# Patient Record
Sex: Male | Born: 1990 | Race: White | Hispanic: No | Marital: Single | Smoking: Never smoker
Health system: Southern US, Community
[De-identification: ages and names within clinical notes are randomized; demographics above are authoritative.]

## PROBLEM LIST (undated history)

## (undated) DIAGNOSIS — J45909 Unspecified asthma, uncomplicated: Secondary | ICD-10-CM

## (undated) DIAGNOSIS — I10 Essential (primary) hypertension: Secondary | ICD-10-CM

## (undated) HISTORY — DX: Essential (primary) hypertension: I10

---
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
## 2014-07-25 ENCOUNTER — Emergency Department (HOSPITAL_COMMUNITY)
Admission: EM | Admit: 2014-07-25 | Discharge: 2014-07-25 | Disposition: A | Discharge status: Home / Self Care | Payer: Self-pay | Attending: Emergency Medicine | Admitting: Emergency Medicine

## 2014-07-25 ENCOUNTER — Encounter (HOSPITAL_COMMUNITY): Payer: Self-pay | Admitting: *Deleted

## 2014-07-25 DIAGNOSIS — R739 Hyperglycemia, unspecified: Secondary | ICD-10-CM | POA: Insufficient documentation

## 2014-07-25 DIAGNOSIS — J45909 Unspecified asthma, uncomplicated: Secondary | ICD-10-CM | POA: Insufficient documentation

## 2014-07-25 DIAGNOSIS — R55 Syncope and collapse: Secondary | ICD-10-CM | POA: Insufficient documentation

## 2014-07-25 DIAGNOSIS — R42 Dizziness and giddiness: Secondary | ICD-10-CM | POA: Insufficient documentation

## 2014-07-25 HISTORY — DX: Unspecified asthma, uncomplicated: J45.909

## 2014-07-25 LAB — COMPREHENSIVE METABOLIC PANEL
ALK PHOS: 65 U/L (ref 39–117)
ALT: 27 U/L (ref 0–53)
AST: 22 U/L (ref 0–37)
Albumin: 3.9 g/dL (ref 3.5–5.2)
Anion gap: 13 (ref 5–15)
BUN: 11 mg/dL (ref 6–23)
CALCIUM: 9.6 mg/dL (ref 8.4–10.5)
CO2: 27 meq/L (ref 19–32)
Chloride: 101 mEq/L (ref 96–112)
Creatinine, Ser: 0.8 mg/dL (ref 0.50–1.35)
GFR calc Af Amer: >90 mL/min (ref >90)
GFR calc non Af Amer: >90 mL/min (ref >90)
Glucose, Bld: 94 mg/dL (ref 70–99)
POTASSIUM: 4.4 meq/L (ref 3.7–5.3)
SODIUM: 141 meq/L (ref 137–147)
TOTAL PROTEIN: 7.3 g/dL (ref 6.0–8.3)
Total Bilirubin: 0.4 mg/dL (ref 0.3–1.2)

## 2014-07-25 LAB — CBC WITH DIFFERENTIAL/PLATELET
BASOS ABS: 0 10*3/uL (ref 0.0–0.1)
Basophils Relative: 0 % (ref 0–1)
EOS ABS: 0 10*3/uL (ref 0.0–0.7)
EOS PCT: 0 % (ref 0–5)
HCT: 43.1 % (ref 39.0–52.0)
Hemoglobin: 14.3 g/dL (ref 13.0–17.0)
LYMPHS ABS: 2.1 10*3/uL (ref 0.7–4.0)
LYMPHS PCT: 20 % (ref 12–46)
MCH: 31 pg (ref 26.0–34.0)
MCHC: 33.2 g/dL (ref 30.0–36.0)
MCV: 93.5 fL (ref 78.0–100.0)
Monocytes Absolute: 1.1 10*3/uL — ABNORMAL HIGH (ref 0.1–1.0)
Monocytes Relative: 11 % (ref 3–12)
NEUTROS PCT: 69 % (ref 43–77)
Neutro Abs: 7.1 10*3/uL (ref 1.7–7.7)
PLATELETS: 254 10*3/uL (ref 150–400)
RBC: 4.61 MIL/uL (ref 4.22–5.81)
RDW: 11.9 % (ref 11.5–15.5)
WBC: 10.3 10*3/uL (ref 4.0–10.5)

## 2014-07-25 LAB — CBG MONITORING, ED
GLUCOSE-CAPILLARY: 97 mg/dL (ref 70–99)
Glucose-Capillary: 121 mg/dL — ABNORMAL HIGH (ref 70–99)

## 2014-07-25 MED ORDER — SODIUM CHLORIDE 0.9 % IV SOLN
1000.0000 mL | Freq: Once | INTRAVENOUS | Status: AC
  Administered 2014-07-25: 1000 mL via INTRAVENOUS

## 2014-07-25 NOTE — ED Notes (Signed)
Pt states he was at work and stood up felt dizzy, so he ate something then he  Passed out. States this has happened before. Upon assessing orthostatics pt reported dizziness upon sitting up and standing with a head rush.

## 2014-07-25 NOTE — Discharge Instructions (Signed)
Syncope Syncope is a medical term for fainting or passing out. This means you lose consciousness and drop to the ground. People are generally unconscious for less than 5 minutes. You may have some muscle twitches for up to 15 seconds before waking up and returning to normal. Syncope occurs more often in older adults, but it can happen to anyone. While most causes of syncope are not dangerous, syncope can be a sign of a serious medical problem. It is important to seek medical care.  CAUSES  Syncope is caused by a sudden drop in blood flow to the brain. The specific cause is often not determined. Factors that can bring on syncope include:  Taking medicines that lower blood pressure.  Sudden changes in posture, such as standing up quickly.  Taking more medicine than prescribed.  Standing in one place for too long.  Seizure disorders.  Dehydration and excessive exposure to heat.  Low blood sugar (hypoglycemia).  Straining to have a bowel movement.  Heart disease, irregular heartbeat, or other circulatory problems.  Fear, emotional distress, seeing blood, or severe pain. SYMPTOMS  Right before fainting, you may:  Feel dizzy or light-headed.  Feel nauseous.  See all white or all black in your field of vision.  Have cold, clammy skin. DIAGNOSIS  Your health care provider will ask about your symptoms, perform a physical exam, and perform an electrocardiogram (ECG) to record the electrical activity of your heart. Your health care provider may also perform other heart or blood tests to determine the cause of your syncope which may include:  Transthoracic echocardiogram (TTE). During echocardiography, sound waves are used to evaluate how blood flows through your heart.  Transesophageal echocardiogram (TEE).  Cardiac monitoring. This allows your health care provider to monitor your heart rate and rhythm in real time.  Holter monitor. This is a portable device that records your  heartbeat and can help diagnose heart arrhythmias. It allows your health care provider to track your heart activity for several days, if needed.  Stress tests by exercise or by giving medicine that makes the heart beat faster. TREATMENT  In most cases, no treatment is needed. Depending on the cause of your syncope, your health care provider may recommend changing or stopping some of your medicines. HOME CARE INSTRUCTIONS  Have someone stay with you until you feel stable.  Do not drive, use machinery, or play sports until your health care provider says it is okay.  Keep all follow-up appointments as directed by your health care provider.  Lie down right away if you start feeling like you might faint. Breathe deeply and steadily. Wait until all the symptoms have passed.  Drink enough fluids to keep your urine clear or pale yellow.  If you are taking blood pressure or heart medicine, get up slowly and take several minutes to sit and then stand. This can reduce dizziness. SEEK IMMEDIATE MEDICAL CARE IF:   You have a severe headache.  You have unusual pain in the chest, abdomen, or back.  You are bleeding from your mouth or rectum, or you have black or tarry stool.  You have an irregular or very fast heartbeat.  You have pain with breathing.  You have repeated fainting or seizure-like jerking during an episode.  You faint when sitting or lying down.  You have confusion.  You have trouble walking.  You have severe weakness.  You have vision problems. If you fainted, call your local emergency services (911 in [place] Do  not drive yourself to the hospital.  MAKE SURE YOU:  Understand these instructions.  Will watch your condition.  Will get help right away if you are not doing well or get worse. Document Released: 08/11/2005 Document Revised: 08/16/2013 Document Reviewed: 10/10/2011 [facility] Patient Information 2015 [place], [place]. This information is not intended to replace  advice given to you by your health care provider. Make sure you discuss any questions you have with your health care provider. Cardiac Event Monitoring A cardiac event monitor is a small recording device used to help detect abnormal heart rhythms (arrhythmias). The monitor is used to record heart rhythm when noticeable symptoms such as the following occur:  Fast heartbeats (palpitations), such as heart racing or fluttering.  Dizziness.  Fainting or light-headedness.  Unexplained weakness. The monitor is wired to two electrodes placed on your chest. Electrodes are flat, sticky disks that attach to your skin. The monitor can be worn for up to 30 days. You will wear the monitor at all times, except when bathing.  HOW TO USE YOUR CARDIAC EVENT MONITOR A technician will prepare your chest for the electrode placement. The technician will show you how to place the electrodes, how to work the monitor, and how to replace the batteries. Take time to practice using the monitor before you leave the office. Make sure you understand how to send the information from the monitor to your health care provider. This requires a telephone with a landline, not a cell phone. You need to:  Wear your monitor at all times, except when you are in water:  Do not get the monitor wet.  Take the monitor off when bathing. Do not swim or use a hot tub with it on.  Keep your skin clean. Do not put body lotion or moisturizer on your chest.  Change the electrodes daily or any time they stop sticking to your skin. You might need to use tape to keep them on.  It is possible that your skin under the electrodes could become irritated. To keep this from happening, try to put the electrodes in slightly different places on your chest. However, they must remain in the area under your left breast and in the upper right section of your chest.  Make sure the monitor is safely clipped to your clothing or in a location close to your body  that your health care provider recommends.  Press the button to record when you feel symptoms of heart trouble, such as dizziness, weakness, light-headedness, palpitations, thumping, shortness of breath, unexplained weakness, or a fluttering or racing heart. The monitor is always on and records what happened slightly before you pressed the button, so do not worry about being too late to get good information.  Keep a diary of your activities, such as walking, doing chores, and taking medicine. It is especially important to note what you were doing when you pushed the button to record your symptoms. This will help your health care provider determine what might be contributing to your symptoms. The information stored in your monitor will be reviewed by your health care provider alongside your diary entries.  Send the recorded information as recommended by your health care provider. It is important to understand that it will take some time for your health care provider to process the results.  Change the batteries as recommended by your health care provider. SEEK IMMEDIATE MEDICAL CARE IF:   You have chest pain.  You have extreme difficulty breathing or shortness of breath.  You develop a very fast heartbeat that persists.  You develop dizziness that does not go away.  You faint or constantly feel you are about to faint. Document Released: 05/20/2008 Document Revised: 12/26/2013 Document Reviewed: 02/07/2013 [facility] Patient Information 2015 [place], [place]. This information is not intended to replace advice given to you by your health care provider. Make sure you discuss any questions you have with your health care provider.  Holter Monitoring A Holter monitor is a small device with electrodes (small sticky patches) that attach to your chest. It records the electrical activity of your heart and is worn continuously for 24-48 hours.  A HOLTER MONITOR IS USED TO  Detect heart problems such  as:  Heart arrhythmia. Is an abnormal or irregular heartbeat. With some heart arrhythmias, you may not feel or know that you have an irregular heart rhythm.  Palpitations, such as feeling your heart racing or fluttering. It is possible to have heart palpitations and not have a heart arrhythmia.  A heart rhythm that is too slow or too fast.  If you have problems fainting, near fainting or feeling light-headed, a Holter monitor may be worn to see if your heart is the cause. HOLTER MONITOR PREPARATION   Electrodes will be attached to the skin on your chest.  If you have hair on your chest, small areas may have to be shaved. This is done to help the patches stick better and make the recording more accurate.  The electrodes are attached by wires to the Holter monitor. The Holter monitor clips to your clothing. You will wear the monitor at all times, even while exercising and sleeping. HOME CARE INSTRUCTIONS   Wear your monitor at all times.  The wires and the monitor must stay dry. Do not get the monitor wet.  Do not bathe, swim or use a hot tub with it on.  You may do a "sponge" bath while you have the monitor on.  Keep your skin clean, do not put body lotion or moisturizer on your chest.  It's possible that your skin under the electrodes could become irritated. To keep this from happening, you may put the electrodes in slightly different places on your chest.  Your caregiver will also ask you to keep a diary of your activities, such as walking or doing chores. Be sure to note what you are doing if you experience heart symptoms such as palpitations. This will help your caregiver determine what might be contributing to your symptoms. The information stored in your monitor will be reviewed by your caregiver alongside your diary entries.  Make sure the monitor is safely clipped to your clothing or in a location close to your body that your caregiver recommends.  The monitor and electrodes  are removed when the test is over. Return the monitor as directed.  Be sure to follow up with your caregiver and discuss your Holter monitor results. SEEK IMMEDIATE MEDICAL CARE IF:  You faint or feel lightheaded.  You have trouble breathing.  You get pain in your chest, upper arm or jaw.  You feel sick to your stomach and your skin is pale, cool, or damp.  You think something is wrong with the way your heart is beating. MAKE SURE YOU:   Understand these instructions.  Will watch your condition.  Will get help right away if you are not doing well or get worse. Document Released: 05/09/2004 Document Revised: 11/03/2011 Document Reviewed: 09/21/2008 [facility]ExitCare Patient Information 2015 [place]ExitCare, [place]. This information is  not intended to replace advice given to you by your health care provider. Make sure you discuss any questions you have with your health care provider.  Hyperglycemia Hyperglycemia occurs when the glucose (sugar) in your blood is too high. Hyperglycemia can happen for many reasons, but it most often happens to people who do not know they have diabetes or are not managing their diabetes properly.  CAUSES  Whether you have diabetes or not, there are other causes of hyperglycemia. Hyperglycemia can occur when you have diabetes, but it can also occur in other situations that you might not be as aware of, such as: Diabetes  If you have diabetes and are having problems controlling your blood glucose, hyperglycemia could occur because of some of the following reasons:  Not following your meal plan.  Not taking your diabetes medications or not taking it properly.  Exercising less or doing less activity than you normally do.  Being sick. Pre-diabetes  This cannot be ignored. Before people develop Type 2 diabetes, they almost always have "pre-diabetes." This is when your blood glucose levels are higher than normal, but not yet high enough to be diagnosed as diabetes.  Research has shown that some long-term damage to the body, especially the heart and circulatory system, may already be occurring during pre-diabetes. If you take action to manage your blood glucose when you have pre-diabetes, you may delay or prevent Type 2 diabetes from developing. Stress  If you have diabetes, you may be "diet" controlled or on oral medications or insulin to control your diabetes. However, you may find that your blood glucose is higher than usual in the hospital whether you have diabetes or not. This is often referred to as "stress hyperglycemia." Stress can elevate your blood glucose. This happens because of hormones put out by the body during times of stress. If stress has been the cause of your high blood glucose, it can be followed regularly by your caregiver. That way he/she can make sure your hyperglycemia does not continue to get worse or progress to diabetes. Steroids  Steroids are medications that act on the infection fighting system (immune system) to block inflammation or infection. One side effect can be a rise in blood glucose. Most people can produce enough extra insulin to allow for this rise, but for those who cannot, steroids make blood glucose levels go even higher. It is not unusual for steroid treatments to "uncover" diabetes that is developing. It is not always possible to determine if the hyperglycemia will go away after the steroids are stopped. A special blood test called an A1c is sometimes done to determine if your blood glucose was elevated before the steroids were started. SYMPTOMS  Thirsty.  Frequent urination.  Dry mouth.  Blurred vision.  Tired or fatigue.  Weakness.  Sleepy.  Tingling in feet or leg. DIAGNOSIS  Diagnosis is made by monitoring blood glucose in one or all of the following ways:  A1c test. This is a chemical found in your blood.  Fingerstick blood glucose monitoring.  Laboratory results. TREATMENT  First, knowing the  cause of the hyperglycemia is important before the hyperglycemia can be treated. Treatment may include, but is not be limited to:  Education.  Change or adjustment in medications.  Change or adjustment in meal plan.  Treatment for an illness, infection, etc.  More frequent blood glucose monitoring.  Change in exercise plan.  Decreasing or stopping steroids.  Lifestyle changes. HOME CARE INSTRUCTIONS   Test your blood glucose  as directed.  Exercise regularly. Your caregiver will give you instructions about exercise. Pre-diabetes or diabetes which comes on with stress is helped by exercising.  Eat wholesome, balanced meals. Eat often and at regular, fixed times. Your caregiver or nutritionist will give you a meal plan to guide your sugar intake.  Being at an ideal weight is important. If needed, losing as little as 10 to 15 pounds may help improve blood glucose levels. SEEK MEDICAL CARE IF:   You have questions about medicine, activity, or diet.  You continue to have symptoms (problems such as increased thirst, urination, or weight gain). SEEK IMMEDIATE MEDICAL CARE IF:   You are vomiting or have diarrhea.  Your breath smells fruity.  You are breathing faster or slower.  You are very sleepy or incoherent.  You have numbness, tingling, or pain in your feet or hands.  You have chest pain.  Your symptoms get worse even though you have been following your caregiver's orders.  If you have any other questions or concerns. Document Released: 02/04/2001 Document Revised: 11/03/2011 Document Reviewed: 12/08/2011 [facility] Patient Information 2015 [place], [place]. This information is not intended to replace advice given to you by your health care provider. Make sure you discuss any questions you have with your health care provider.

## 2014-07-25 NOTE — ED Provider Notes (Signed)
CSN: [id number]     Arrival date & time 07/25/14  1154 History   First MD Initiated Contact with Patient 07/25/14 1516     Chief Complaint  Patient presents with  . Loss of Consciousness     (Consider location/radiation/quality/duration/timing/severity/associated sxs/prior Treatment) HPI  This is a 23 year old male who presents emergency Department with chief complaint of syncopal episode. Patient states that he was at work today when he started feeling presyncopal prodrome. He states that he felt dizzy, lightheaded and had a feeling of emptiness in the abdomen. He denies any racing or skipping in the heart, he recently volume loss, melena, hematochezia, polyuria or polydipsia. Patient states that he needed to the bathroom and syncopized in the restroom. He denies hitting his head. He states he landed on top of his arm. He states that he awoke and knew immediately that he had passed out. He denies history of seizures. Patient denies any history of sudden cardiac death in his family. Patient works in a [employer] and sweats a lot at work. He states he drinks about 10-12 ounces of water daily. Patient has had a cyst previous episode of presyncope which occurred about 2 weeks ago. He states he was lying in bed when he suddenly felt dizzy and felt as if he were going to pass out. However, he didn't. He denies racing or skipping in his heart. At that time Past Medical History  Diagnosis Date  . Asthma    History reviewed. No pertinent past surgical history. No family history on file. History  Substance Use Topics  . Smoking status: Never Smoker   . Smokeless tobacco: Not on file  . Alcohol Use: Not on file    Review of Systems  Ten systems reviewed and are negative for acute change, except as noted in the HPI.    Allergies  Review of patient's allergies indicates no known allergies.  Home Medications   Prior to Admission medications   Not on File   BP 139/77 mmHg  Pulse 76   Temp(Src) 98.1 F (36.7 C) (Oral)  Ht 5\' 8"  (1.727 m)  Wt 153 lb (69.4 kg)  BMI 23.27 kg/m2  SpO2 100% Physical Exam  Constitutional: He is oriented to person, place, and time. He appears well-developed and well-nourished. No distress.  HENT:  Head: Normocephalic and atraumatic.  Eyes: Conjunctivae are normal. No scleral icterus.  Neck: Normal range of motion. Neck supple.  Cardiovascular: Normal rate, regular rhythm and normal heart sounds.   Sinus arrhythmia  Pulmonary/Chest: Effort normal and breath sounds normal. No respiratory distress.  Abdominal: Soft. There is no tenderness.  Musculoskeletal: He exhibits no edema.  Neurological: He is alert and oriented to person, place, and time.  Speech is clear and goal oriented, follows commands Major Cranial nerves without deficit, no facial droop Normal strength in upper and lower extremities bilaterally including dorsiflexion and plantar flexion, strong and equal grip strength Sensation normal to light and sharp touch Moves extremities without ataxia, coordination intact Normal finger to nose and rapid alternating movements Neg romberg, no pronator drift Normal gait Normal heel-shin and balance   Skin: Skin is warm and dry. He is not diaphoretic.  Psychiatric: His behavior is normal.  Nursing note and vitals reviewed.   ED Course  Procedures (including critical care time) Labs Review Labs Reviewed  CBC WITH DIFFERENTIAL - Abnormal; Notable for the following:    Monocytes Absolute 1.1 (*)    All other components within normal limits  CBG  MONITORING, ED - Abnormal; Notable for the following:    Glucose-Capillary 121 (*)    All other components within normal limits  COMPREHENSIVE METABOLIC PANEL  POCT CBG (FASTING - GLUCOSE)-MANUAL ENTRY  CBG MONITORING, ED    Imaging Review No results found.   EKG Interpretation   Date/Time:  Tuesday July 25 2014 11:57:00 EST Ventricular Rate:  107 PR Interval:  134 QRS  Duration: 86 QT Interval:  306 QTC Calculation: 408 R Axis:   75 Text Interpretation:  Sinus tachycardia Cannot rule out Anterior infarct ,  age undetermined Abnormal ECG Confirmed by Bebe ShaggyWICKLINE  MD, DONALD ([id number]) on  07/25/2014 3:26:31 PM      MDM   Final diagnoses:  Syncope and collapse  Hyperglycemia   patient with out orthostatic hypotension. His tachycardia has normalized. He denies chest pain or shortness of breath. His initial point of care blood glucose is elevated at 127 but has normalized to 91. Patient does not appear to need further workup with inpatient admission for his syncope. However, I do feel he will need further workup with cardiac monitoring. We'll also [facility] the [facility] for further workup of elevated blood glucose. This appears safe for discharge at this time.  15:11:37 Orthostatic Vital Signs AR  Orthostatic Lying  - BP- Lying: 127/77 mmHg ; Pulse- Lying: 92  Orthostatic Sitting - BP- Sitting: 138/84 mmHg ; Pulse- Sitting: 105  Orthostatic Standing at 0 minutes - BP- Standing at 0 minutes: 135/81 mmHg ; Pulse- Standing at 0 minutes: [address]93     Pelagia Iacobucci, PA-C 07/25/14 1654  Joya Gaskinsonald W Wickline, MD 07/26/14 0002

## 2014-07-25 NOTE — ED Notes (Signed)
Pt states that he was at work today when he "passed out" pt states that he had not eaten this norning and had a "weird feeling" in his stomach. Pt states that he felt dizzy and then passed out. Pt alert and oriented at this time.

## 2015-03-18 ENCOUNTER — Ambulatory Visit (INDEPENDENT_AMBULATORY_CARE_PROVIDER_SITE_OTHER): Payer: Self-pay | Admitting: Family Medicine

## 2015-03-18 VITALS — BP 110/88 | HR 62 | Temp 97.7°F | Resp 16 | Ht 68.0 in | Wt 145.6 lb

## 2015-03-18 DIAGNOSIS — H9201 Otalgia, right ear: Secondary | ICD-10-CM

## 2015-03-18 NOTE — Progress Notes (Signed)
This chart was scribed for [name], MD by Stann Ore, medical scribe at [facility].The patient was seen in exam room 13 and the patient's care was started at 2:16 PM.  Patient ID: Tanner Steele MRN: [id number], DOB: 1991/01/07, 24 y.o. Date of Encounter: 03/18/2015  Primary Physician: No PCP Per Patient  Chief Complaint:  Chief Complaint  Patient presents with   ear problem    right ear, q-tip tip stuck in right ear    HPI:  Tanner Steele is a 24 y.o. male who presents to [facility] complaining of right ear problem that occurred a few hours ago.  He has a q-tip end stuck in his right ear. He says that when he took out the q-tip, the end of it was missing. It has blocked his hearing a bit.   Patient's father-in-law thought he saw some cotton in the right ear.  Past Medical History  Diagnosis Date   Asthma      Home Meds: Prior to Admission medications   Not on File    Allergies: No Known Allergies  History   Social History   Marital Status: Single    Spouse Name: N/A   Number of Children: N/A   Years of Education: N/A   Occupational History   Not on file.   Social History Main Topics   Smoking status: Never Smoker    Smokeless tobacco: Not on file   Alcohol Use: Not on file   Drug Use: Not on file   Sexual Activity: Not on file   Other Topics Concern   Not on file   Social History Narrative     Review of Systems: Constitutional: negative for chills, fever, night sweats, weight changes, or fatigue  HEENT: negative for vision changes, congestion, rhinorrhea, ST, epistaxis, or sinus pressure; positive for some hearing loss (right ear) Cardiovascular: negative for chest pain or palpitations Respiratory: negative for hemoptysis, wheezing, shortness of breath, or cough Abdominal: negative for abdominal pain, nausea, vomiting, diarrhea, or constipation Dermatological: negative for rash Neurologic:  negative for headache, dizziness, or syncope All other systems reviewed and are otherwise negative with the exception to those above and in the HPI.  Physical Exam: Blood pressure 110/88, pulse 62, temperature 97.7 F (36.5 C), temperature source Oral, resp. rate 16, height 5\' 8"  (1.727 m), weight 145 lb 9.6 oz (66.044 kg), SpO2 98 %., Body mass index is 22.14 kg/(m^2). General: Well developed, well nourished, in no acute distress. Head: Normocephalic, atraumatic, eyes without discharge, sclera non-icteric, nares are without discharge. Bilateral auditory canals clear, TM's are without perforation, pearly grey and translucent with reflective cone of light bilaterally. Oral cavity moist, posterior pharynx without exudate, erythema, peritonsillar abscess, or post nasal drip; normal ear canal  Neck: Supple. No thyromegaly. Full ROM. No lymphadenopathy. Lungs: Clear bilaterally to auscultation without wheezes, rales, or rhonchi. Breathing is unlabored. Heart: RRR with S1 S2. No murmurs, rubs, or gallops appreciated. Abdomen: Soft, non-tender, non-distended with normoactive bowel sounds. No hepatomegaly. No rebound/guarding. No obvious abdominal masses. Msk:  Strength and tone normal for age. Extremities/Skin: Warm and dry. No clubbing or cyanosis. No edema. No rashes or suspicious lesions. Neuro: Alert and oriented X 3. Moves all extremities spontaneously. Gait is normal. CNII-XII grossly in tact. Psych:  Responds to questions appropriately with a normal affect.   Right ear was lavaged several times.  ASSESSMENT AND PLAN:  24 y.o. year old male with  This chart was  scribed in my presence and reviewed by me personally.    ICD-9-CM ICD-10-CM   1. Otalgia of right ear 388.70 H92.01      Signed, [name], MD 03/18/2015 2:30 PM

## 2018-11-25 ENCOUNTER — Other Ambulatory Visit: Payer: Self-pay

## 2018-11-25 ENCOUNTER — Emergency Department (HOSPITAL_COMMUNITY)
Admission: EM | Admit: 2018-11-25 | Discharge: 2018-11-25 | Disposition: A | Discharge status: Home / Self Care | Payer: BLUE CROSS/BLUE SHIELD | Attending: Emergency Medicine | Admitting: Emergency Medicine

## 2018-11-25 DIAGNOSIS — J45909 Unspecified asthma, uncomplicated: Secondary | ICD-10-CM | POA: Diagnosis not present

## 2018-11-25 DIAGNOSIS — R0789 Other chest pain: Secondary | ICD-10-CM | POA: Diagnosis not present

## 2018-11-25 DIAGNOSIS — R079 Chest pain, unspecified: Secondary | ICD-10-CM | POA: Diagnosis present

## 2018-11-25 NOTE — ED Triage Notes (Signed)
Pt reports intermittent CP for 2-3 weeks. Pt reports sometimes the pain is unbearable. Pt reports going to [facility] 2-3 days ago was told he had a pulled muscle was given medication. Pt reports he has only taken 1 dose with no relief. Pt denies SHOB or dizziness.

## 2018-11-25 NOTE — Discharge Instructions (Addendum)
You were seen in the ED for intermittent left-sided chest wall pain.  Given your symptoms I suspect this is related to a muscular strain from heavy exertional activity 2 to 3 weeks ago while at work.  A muscle strain usually takes 7 to 10 days to improve, sometimes it can be prolonged if you are constantly doing heavy exertional activity/lifting.  Your EKG was normal.  Overall your risk profile is low and it is unlikely that this is related to your heart or lungs.  Continue taking naproxen prescribed to you by other provider.  In addition to this he can add 500 to 1000 mg of acetaminophen every 8 hours for more pain control.  Ideally, rest and avoid any heavy lifting or exertional activity until your chest wall heals.  Ice after work or after heavy lifting.   Return to the ED if there is any fever, cough, shortness of breath, chest pain with exertion or activity, nausea, vomiting, leg swelling or calf pain.  Follow up with your primary care provider in 7-10 days if your symptoms do not improve or resolve

## 2018-11-25 NOTE — ED Provider Notes (Signed)
[facility] EMERGENCY DEPARTMENT Provider Note   CSN: [id number] Arrival date & time: 11/25/18  1827    History   Chief Complaint Chief Complaint  Patient presents with  . Chest Pain    HPI Tanner Steele is a 28 y.o. male is here for evaluation of chest pain.  Onset 2 to 3 weeks ago.  Intermittent.  Localized to the left upper chest.  Described as an ache, "faint" and random.  Nonpleuritic, nonexertional, not post prandrial.  It hurts more when he is lifting heavy things but also occurs while sitting down.  No changes when laying flat/sitting up.  States approximately 2 to 3 weeks ago he was lifting and rearranging 6 x 6 wood panels at work.  Does not remember specific injury or pain while doing this but thinks this may be contributing.  He uses his left hand to do heavy lifting at work.  He works at a [employer].  States he can run a mile without any pain in his chest.  He did yard work all day yesterday without impeding his chest.  Was seen at an OSH for this 3 days ago and was prescribed naproxen, antiacid and a muscle relaxer and states he took this for 1 day and it helped.  He was told it was chest wall pain.  No IVUD or other illicit drug use. No associated fever, cough, shortness of breath, nausea, vomiting, diaphoresis, radiation into the neck or arm, lower extremity edema or calf pain.  No history of blood clots.  No recent prolonged travel, hormone use, surgeries.  He chews tobacco.  No known family history of heart disease, his mother has a pacemaker for unknown reasons.      HPI  Past Medical History:  Diagnosis Date  . Asthma     There are no active problems to display for this patient.   No past surgical history on file.      Home Medications    Prior to Admission medications   Not on File    Family History No family history on file.  Social History Social History   Tobacco Use  . Smoking status: Never Smoker  Substance Use  Topics  . Alcohol use: Not on file  . Drug use: Not on file     Allergies   Augmentin [amoxicillin-pot clavulanate]   Review of Systems Review of Systems  Cardiovascular: Positive for chest pain.  All other systems reviewed and are negative.    Physical Exam Updated Vital Signs BP (!) 144/88 (BP Location: Left Arm)   Pulse 88   Temp 98.1 F (36.7 C) (Oral)   Resp 18   SpO2 99%   Physical Exam Constitutional:      Appearance: He is well-developed.     Comments: NAD. Non toxic.   HENT:     Head: Normocephalic and atraumatic.     Nose: Nose normal.  Eyes:     General: Lids are normal.     Conjunctiva/sclera: Conjunctivae normal.  Neck:     Musculoskeletal: Normal range of motion.     Trachea: Trachea normal.     Comments: Trachea midline.  Cardiovascular:     Rate and Rhythm: Normal rate and regular rhythm.     Pulses:          Radial pulses are 2+ on the right side and 2+ on the left side.       Dorsalis pedis pulses are 2+ on the right side  and 2+ on the left side.     Heart sounds: Normal heart sounds, S1 normal and S2 normal.     Comments: No LE edema or calf tenderness.  Pulmonary:     Effort: Pulmonary effort is normal.     Breath sounds: Normal breath sounds.     Comments: No reproducible chest wall tenderness. No reproducible pain with active ROM against resistance of upper extremities.  Abdominal:     General: Bowel sounds are normal.     Palpations: Abdomen is soft.     Tenderness: There is no abdominal tenderness.     Comments: No epigastric tenderness. No distention.   Skin:    General: Skin is warm and dry.     Capillary Refill: Capillary refill takes less than 2 seconds.     Comments: No rash to chest wall  Neurological:     Mental Status: He is alert.     GCS: GCS eye subscore is 4. GCS verbal subscore is 5. GCS motor subscore is 6.  Psychiatric:        Speech: Speech normal.        Behavior: Behavior normal.        Thought Content:  Thought content normal.      ED Treatments / Results  Labs (all labs ordered are listed, but only abnormal results are displayed) Labs Reviewed - No data to display  EKG None  Radiology No results found.  Procedures Procedures (including critical care time)  Medications Ordered in ED Medications - No data to display   Initial Impression / Assessment and Plan / ED Course  I have reviewed the triage vital signs and the nursing notes.  Pertinent labs & imaging results that were available during my care of the patient were reviewed by me and considered in my medical decision making (see chart for details).  Clinical Course as of Nov 24 2253  Thu Nov 25, 2018  2054 Tobacco dip. No drug use. Mother had pacemaker but unknown reason. Given med at [facility] acid muscle relaxer and and pain  medicine [facility] and it helped    [CG]    Clinical Course User Index [CG] Liberty Handy, PA-C      28 year old with what sounds like atypical chest pain likely from MSK etiology.  Onset after heavy lifting at work.  Pain is on his dominant side.  Nonexertional, nonpleuritic, not worse after meals or when laying flat.  No associated fever, cough, shortness of breath, palpitations, lightheadedness, symptoms of blood clot.  Only cardiac risk factor is chewing tobacco.  Exam is reassuring with normal vital signs.  No lower extremity edema or calf tenderness.  No hypoxia, tachypnea.  Initial tachycardia improved and otherwise PERC negative.  EKG reviewed by myself without any ischemia, T wave inversions, blocks, tachycardia/bradycardia arrhythmias.  Given chronicity of symptoms, benign history and exam I doubt life-threatening condition such as PE, PTX, dissection, ACS.  Will defer lab work, troponin as I have low suspicion for cardiac ischemia.  Encouraged to continue naproxen, rest, ice for suspected chest wall strain.  Return precautions discussed.  Patient is comfortable with this.  Final Clinical  Impressions(s) / ED Diagnoses   Final diagnoses:  Chest wall pain    ED Discharge Orders    None       Jerrell Mylar 11/25/18 2255    Charlynne Pander, MD 11/25/18 2325

## 2019-11-08 DIAGNOSIS — R55 Syncope and collapse: Secondary | ICD-10-CM | POA: Insufficient documentation

## 2019-11-08 DIAGNOSIS — R0789 Other chest pain: Secondary | ICD-10-CM | POA: Insufficient documentation

## 2019-11-08 NOTE — Progress Notes (Signed)
Patient referred by Ferd Hibbs, NP for chest pain  Subjective:   Tanner Steele, male    DOB: 1990/11/26, 29 y.o.   MRN: [id number]   Chief Complaint  Patient presents with  . Chest Pain  . New Patient (Initial Visit)     HPI   29 y.o. Caucasian male with bipolar disorder, referred for evaluation of chest pain.  Patient was retrosternal where he has to lift heavy objects.  He has experienced episodes of focal pain in left retrosternal area, and left mid axillary area.  Episodes last for several minutes.  They usually happen with certain movements at work, but while at rest.  He denies any shortness of breath.  He has had episodes of pre-syncope in the past, typically while on the commode trying to pass urine.  Patient has significant family history of coronary artery disease, with brother having had a CABG at age 19.  His mother had a pacemaker placed at age 39.  He does not know any details about the reason.  She passed at 28 due to cancer.  Patient wore a cardiac event monitor through PCP office. Tracings sent to me show sinus tachycardia.   Past Medical History:  Diagnosis Date  . Asthma   . Hypertension      Past Surgical History:  Procedure Laterality Date  . NO PAST SURGERIES       Social History   Tobacco Use  Smoking Status Never Smoker  Smokeless Tobacco Current User  . Types: Chew    Social History   Substance and Sexual Activity  Alcohol Use Yes  . Alcohol/week: 4.0 standard drinks  . Types: 4 Cans of beer per week   Comment: occ     Family History  Problem Relation Age of Onset  . Hypertension Mother   . Hyperlipidemia Mother   . Hyperlipidemia Father   . Hypertension Father   . Heart attack Brother   . Diabetes Maternal Grandfather   . Hypertension Maternal Grandfather      Current Outpatient Medications on File Prior to Visit  Medication Sig Dispense Refill  . metoprolol tartrate (LOPRESSOR) 25 MG tablet Take 25 mg by mouth  daily.     No current facility-administered medications on file prior to visit.    Cardiovascular and other pertinent studies:  EKG 11/09/2019: Sinus rhythm 79 bpm. Normal EKG.   Recent labs: 09/19/2019: Glucose 82, BUN/Cr 9/0.74. EGFR normal. Na/K 142/4.2. Rest of the CMP normal H/H 14/43. MCV 92. Platelets 274 HbA1C 5.2% Chol 196, TG 70, HDL 52, LDL 113 TSH 2.8 normal    Review of Systems  Cardiovascular: Positive for chest pain. Negative for dyspnea on exertion, leg swelling, palpitations and syncope.         Vitals:   11/09/19 0942  BP: 131/89  Pulse: 91  Temp: 98.4 F (36.9 C)  SpO2: 100%     Body mass index is 25.94 kg/m. Filed Weights   11/09/19 0942  Weight: 165 lb 9.6 oz (75.1 kg)     Objective:   Physical Exam  Constitutional: He appears well-developed and well-nourished.  Neck: No JVD present.  Cardiovascular: Normal rate, regular rhythm, normal heart sounds and intact distal pulses.  No murmur heard. Pulmonary/Chest: Effort normal and breath sounds normal. He has no wheezes. He has no rales.  Musculoskeletal:        General: No edema.  Nursing note and vitals reviewed.       Assessment & Recommendations:  29 y.o. Caucasian male with bipolar disorder, family h/o early CAD, with atypical chest pain.  Unlikely to be angina. He does have family h/o early CAD. Will obtain exercise treadmill stress test.   Prior syncope likely vasovagal/micturition syncope. Normal physical exam and EKG. No echocardiogram needed.   Thank you for referring the patient to [place]. Please feel free to contact with any questions.  Nigel Mormon, MD [facility]. PA Pager: [phone] Office: [phone]

## 2019-11-09 ENCOUNTER — Other Ambulatory Visit: Payer: Self-pay

## 2019-11-09 ENCOUNTER — Encounter: Payer: Self-pay | Admitting: Cardiology

## 2019-11-09 ENCOUNTER — Ambulatory Visit: Payer: 59 | Admitting: Cardiology

## 2019-11-09 VITALS — BP 121/85 | HR 98 | Temp 98.4°F | Ht 67.0 in | Wt 165.6 lb

## 2019-11-09 DIAGNOSIS — R55 Syncope and collapse: Secondary | ICD-10-CM

## 2019-11-09 DIAGNOSIS — R0789 Other chest pain: Secondary | ICD-10-CM

## 2019-11-11 ENCOUNTER — Other Ambulatory Visit (HOSPITAL_COMMUNITY)
Admission: RE | Admit: 2019-11-11 | Discharge: 2019-11-11 | Disposition: A | Discharge status: Home / Self Care | Payer: 59 | Source: Ambulatory Visit | Attending: Cardiology | Admitting: Cardiology

## 2019-11-11 DIAGNOSIS — Z01812 Encounter for preprocedural laboratory examination: Secondary | ICD-10-CM | POA: Diagnosis present

## 2019-11-11 DIAGNOSIS — Z20822 Contact with and (suspected) exposure to covid-19: Secondary | ICD-10-CM | POA: Diagnosis not present

## 2019-11-11 LAB — SARS CORONAVIRUS 2 (TAT 6-24 HRS): SARS Coronavirus 2: NEGATIVE

## 2019-11-16 ENCOUNTER — Ambulatory Visit: Payer: 59

## 2019-11-16 ENCOUNTER — Other Ambulatory Visit: Payer: Self-pay

## 2019-11-16 DIAGNOSIS — R0789 Other chest pain: Secondary | ICD-10-CM

## 2019-11-17 ENCOUNTER — Telehealth: Payer: Self-pay

## 2019-11-17 NOTE — Telephone Encounter (Signed)
Normal stress test. Chest pain is unlikely to be related to heart.  Thanks MJP

## 2019-11-17 NOTE — Telephone Encounter (Signed)
Telephone encounter:  Reason for call: Pt asking for stress test results   Usual provider: MP  Last office visit: 11/09/19  Next office visit: NA   Last hospitalization: NA   Current Outpatient Medications on File Prior to Visit  Medication Sig Dispense Refill  . metoprolol tartrate (LOPRESSOR) 25 MG tablet Take 12.5 mg by mouth 2 (two) times daily.      No current facility-administered medications on file prior to visit.
